# Patient Record
Sex: Male | Born: 2000 | Hispanic: No | Marital: Single | Smoking: Never smoker
Health system: Southern US, Community
[De-identification: ages and names within clinical notes are randomized; demographics above are authoritative.]

## PROBLEM LIST (undated history)

## (undated) DIAGNOSIS — R519 Headache, unspecified: Secondary | ICD-10-CM

## (undated) DIAGNOSIS — M869 Osteomyelitis, unspecified: Secondary | ICD-10-CM

## (undated) DIAGNOSIS — R229 Localized swelling, mass and lump, unspecified: Secondary | ICD-10-CM

## (undated) HISTORY — DX: Headache, unspecified: R51.9

---
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
## 2020-01-15 ENCOUNTER — Encounter (HOSPITAL_COMMUNITY): Payer: Self-pay | Admitting: Emergency Medicine

## 2020-01-15 ENCOUNTER — Emergency Department (HOSPITAL_COMMUNITY)
Admission: EM | Admit: 2020-01-15 | Discharge: 2020-01-15 | Disposition: A | Discharge status: Home / Self Care | Payer: Medicaid Other | Attending: Emergency Medicine | Admitting: Emergency Medicine

## 2020-01-15 DIAGNOSIS — Z5321 Procedure and treatment not carried out due to patient leaving prior to being seen by health care provider: Secondary | ICD-10-CM | POA: Insufficient documentation

## 2020-01-15 DIAGNOSIS — R0981 Nasal congestion: Secondary | ICD-10-CM | POA: Diagnosis not present

## 2020-01-15 DIAGNOSIS — R059 Cough, unspecified: Secondary | ICD-10-CM | POA: Diagnosis not present

## 2020-01-15 DIAGNOSIS — M791 Myalgia, unspecified site: Secondary | ICD-10-CM | POA: Insufficient documentation

## 2020-01-15 DIAGNOSIS — R6883 Chills (without fever): Secondary | ICD-10-CM | POA: Insufficient documentation

## 2020-01-15 DIAGNOSIS — R519 Headache, unspecified: Secondary | ICD-10-CM | POA: Insufficient documentation

## 2020-01-15 NOTE — ED Triage Notes (Signed)
Pt reports HA, intermittent body aches, congestion, cough, chills x 2 days. Denies N/V/D and exposure to COVID. Pt is unvaccinated.
